# Patient Record
Sex: Female | Born: 1980 | Race: Black or African American | Hispanic: No | Marital: Married | Smoking: Never smoker
Health system: Southern US, Community
[De-identification: ages and names within clinical notes are randomized; demographics above are authoritative.]

---

## 2020-08-29 ENCOUNTER — Encounter (HOSPITAL_COMMUNITY): Payer: Self-pay | Admitting: Emergency Medicine

## 2020-08-29 ENCOUNTER — Other Ambulatory Visit: Payer: Self-pay

## 2020-08-29 ENCOUNTER — Ambulatory Visit (HOSPITAL_COMMUNITY)
Admission: EM | Admit: 2020-08-29 | Discharge: 2020-08-29 | Disposition: A | Discharge status: Home / Self Care | Payer: Self-pay | Attending: Urgent Care | Admitting: Urgent Care

## 2020-08-29 DIAGNOSIS — R519 Headache, unspecified: Secondary | ICD-10-CM | POA: Insufficient documentation

## 2020-08-29 DIAGNOSIS — R070 Pain in throat: Secondary | ICD-10-CM | POA: Insufficient documentation

## 2020-08-29 DIAGNOSIS — B349 Viral infection, unspecified: Secondary | ICD-10-CM | POA: Insufficient documentation

## 2020-08-29 DIAGNOSIS — Z20822 Contact with and (suspected) exposure to covid-19: Secondary | ICD-10-CM | POA: Insufficient documentation

## 2020-08-29 LAB — RESP PANEL BY RT-PCR (FLU A&B, COVID) ARPGX2
Influenza A by PCR: NEGATIVE
Influenza B by PCR: NEGATIVE
SARS Coronavirus 2 by RT PCR: NEGATIVE

## 2020-08-29 MED ORDER — BENZONATATE 100 MG PO CAPS: 100.0000 mg | ORAL_CAPSULE | Freq: Three times a day (TID) | ORAL | 0 refills | Status: AC | PRN

## 2020-08-29 MED ORDER — PSEUDOEPHEDRINE HCL 60 MG PO TABS: 60.0000 mg | ORAL_TABLET | Freq: Three times a day (TID) | ORAL | 0 refills | Status: AC | PRN

## 2020-08-29 MED ORDER — PROMETHAZINE-DM 6.25-15 MG/5ML PO SYRP: 5.0000 mL | ORAL_SOLUTION | Freq: Every evening | ORAL | 0 refills | Status: AC | PRN

## 2020-08-29 MED ORDER — CETIRIZINE HCL 10 MG PO TABS: 10.0000 mg | ORAL_TABLET | Freq: Every day | ORAL | 0 refills | Status: AC

## 2020-08-29 NOTE — ED Triage Notes (Signed)
PT C/O: cold sx onset 4 days...Marland Kitchen Just arrived from [place] in [place] ... Sx include: chills, fever, headache, sore throat, fatigue  Husband speaks [place] and is interpreting.   DENIES: v/d  TAKING MEDS: OTC acetaminophen  A&O x4... NAD... Ambulatory

## 2020-08-29 NOTE — Discharge Instructions (Signed)

## 2020-08-29 NOTE — ED Provider Notes (Signed)
  [facility]   MRN: [id number] DOB: Aug 27, 1981  Subjective:   Alexandria Walker is a 39 y.o. female presenting for 4-day history of acute onset chills, malaise and fatigue, subjective fever, headache, throat pain intermittent body aches.  Patient just arrived from a total last [place] Sunday when her symptoms started.  She has both Covid and flu vaccinated.  Denies chest pain, shortness of breath, loss of sense of taste and smell, rashes.  She is not currently taking any medications and has no known food or drug allergies.  Denies past medical and surgical history.  History reviewed. No pertinent family history.  Social History   Tobacco Use  . Smoking status: Never Smoker  Substance Use Topics  . Alcohol use: Not Currently  . Drug use: Never    ROS   Objective:   Vitals: BP 108/78 (BP Location: Right Arm)   Pulse 90   Temp 99.6 F (37.6 C) (Oral)   Resp 20   SpO2 99%   Physical Exam Constitutional:      General: She is not in acute distress.    Appearance: Normal appearance. She is well-developed. She is not ill-appearing, toxic-appearing or diaphoretic.  HENT:     Head: Normocephalic and atraumatic.     Nose: Nose normal.     Mouth/Throat:     Mouth: Mucous membranes are moist.     Pharynx: No oropharyngeal exudate or posterior oropharyngeal erythema.  Eyes:     General: No scleral icterus.       Right eye: No discharge.        Left eye: No discharge.     Extraocular Movements: Extraocular movements intact.     Conjunctiva/sclera: Conjunctivae normal.     Pupils: Pupils are equal, round, and reactive to light.  Cardiovascular:     Rate and Rhythm: Normal rate and regular rhythm.     Pulses: Normal pulses.     Heart sounds: Normal heart sounds. No murmur heard. No friction rub. No gallop.   Pulmonary:     Effort: Pulmonary effort is normal. No respiratory distress.     Breath sounds: Normal breath sounds. No stridor. No wheezing,  rhonchi or rales.  Skin:    General: Skin is warm and dry.     Findings: No rash.  Neurological:     Mental Status: She is alert and oriented to person, place, and time.  Psychiatric:        Mood and Affect: Mood normal.        Behavior: Behavior normal.        Thought Content: Thought content normal.        Judgment: Judgment normal.     Assessment and Plan :   PDMP not reviewed this encounter.  1. Viral syndrome   2. Throat pain     Suspect viral syndrome such as influenza but the common cold plan Covid are also possible.  Testing is pending.  Recommended supportive care.  Physical exam findings, vital signs stable for outpatient management. Counseled patient on potential for adverse effects with medications prescribed/recommended today, ER and return-to-clinic precautions discussed, patient verbalized understanding.    Wallis Bamberg, PA-C 08/29/20 1420

## 2021-02-09 ENCOUNTER — Other Ambulatory Visit: Payer: Self-pay

## 2021-02-09 ENCOUNTER — Emergency Department (HOSPITAL_COMMUNITY)
Admission: EM | Admit: 2021-02-09 | Discharge: 2021-02-10 | Disposition: A | Discharge status: Home / Self Care | Payer: Self-pay | Attending: Emergency Medicine | Admitting: Emergency Medicine

## 2021-02-09 DIAGNOSIS — Z20822 Contact with and (suspected) exposure to covid-19: Secondary | ICD-10-CM | POA: Insufficient documentation

## 2021-02-09 DIAGNOSIS — B9689 Other specified bacterial agents as the cause of diseases classified elsewhere: Secondary | ICD-10-CM | POA: Insufficient documentation

## 2021-02-09 DIAGNOSIS — R531 Weakness: Secondary | ICD-10-CM | POA: Insufficient documentation

## 2021-02-09 DIAGNOSIS — N390 Urinary tract infection, site not specified: Secondary | ICD-10-CM | POA: Insufficient documentation

## 2021-02-09 NOTE — ED Triage Notes (Signed)
[place] interpreter attempted for triage.  Pt came in accompanied by husband with c/o headache, generalized body aches and weakness for three. Days. Pt is febrile at 100.38f oral. Pt last took Tylenol yesterday

## 2021-02-10 ENCOUNTER — Emergency Department (HOSPITAL_COMMUNITY): Payer: Self-pay

## 2021-02-10 ENCOUNTER — Encounter (HOSPITAL_COMMUNITY): Payer: Self-pay | Admitting: Emergency Medicine

## 2021-02-10 LAB — CBC
HCT: 48 % — ABNORMAL HIGH (ref 36.0–46.0)
Hemoglobin: 15.1 g/dL — ABNORMAL HIGH (ref 12.0–15.0)
MCH: 27.5 pg (ref 26.0–34.0)
MCHC: 31.5 g/dL (ref 30.0–36.0)
MCV: 87.4 fL (ref 80.0–100.0)
Platelets: 180 10*3/uL (ref 150–400)
RBC: 5.49 MIL/uL — ABNORMAL HIGH (ref 3.87–5.11)
RDW: 13.1 % (ref 11.5–15.5)
WBC: 10.2 10*3/uL (ref 4.0–10.5)
nRBC: 0 % (ref 0.0–0.2)

## 2021-02-10 LAB — COMPREHENSIVE METABOLIC PANEL
ALT: 48 U/L — ABNORMAL HIGH (ref 0–44)
AST: 43 U/L — ABNORMAL HIGH (ref 15–41)
Albumin: 4 g/dL (ref 3.5–5.0)
Alkaline Phosphatase: 70 U/L (ref 38–126)
Anion gap: 9 (ref 5–15)
BUN: 13 mg/dL (ref 6–20)
CO2: 28 mmol/L (ref 22–32)
Calcium: 9.9 mg/dL (ref 8.9–10.3)
Chloride: 103 mmol/L (ref 98–111)
Creatinine, Ser: 0.94 mg/dL (ref 0.44–1.00)
GFR, Estimated: >60 mL/min (ref >60)
Glucose, Bld: 121 mg/dL — ABNORMAL HIGH (ref 70–99)
Potassium: 4.5 mmol/L (ref 3.5–5.1)
Sodium: 140 mmol/L (ref 135–145)
Total Bilirubin: 0.9 mg/dL (ref 0.3–1.2)
Total Protein: 9 g/dL — ABNORMAL HIGH (ref 6.5–8.1)

## 2021-02-10 LAB — URINALYSIS, ROUTINE W REFLEX MICROSCOPIC
Bilirubin Urine: NEGATIVE
Glucose, UA: NEGATIVE mg/dL
Ketones, ur: NEGATIVE mg/dL
Nitrite: POSITIVE — AB
Protein, ur: NEGATIVE mg/dL
Specific Gravity, Urine: 1.013 (ref 1.005–1.030)
pH: 6 (ref 5.0–8.0)

## 2021-02-10 LAB — PREGNANCY, URINE: Preg Test, Ur: NEGATIVE

## 2021-02-10 LAB — RESP PANEL BY RT-PCR (FLU A&B, COVID) ARPGX2
Influenza A by PCR: NEGATIVE
Influenza B by PCR: NEGATIVE
SARS Coronavirus 2 by RT PCR: NEGATIVE

## 2021-02-10 MED ORDER — SODIUM CHLORIDE 0.9 % IV BOLUS
1000.0000 mL | Freq: Once | INTRAVENOUS | Status: AC
  Administered 2021-02-10: 1000 mL via INTRAVENOUS

## 2021-02-10 MED ORDER — CEPHALEXIN 500 MG PO CAPS: 500.0000 mg | ORAL_CAPSULE | Freq: Three times a day (TID) | ORAL | 0 refills | Status: AC

## 2021-02-10 MED ORDER — SODIUM CHLORIDE 0.9 % IV SOLN
2.0000 g | Freq: Once | INTRAVENOUS | Status: AC
  Administered 2021-02-10: 03:00:00 2 g via INTRAVENOUS
  Filled 2021-02-10: qty 20

## 2021-02-10 MED ORDER — ONDANSETRON HCL 4 MG/2ML IJ SOLN
4.0000 mg | Freq: Once | INTRAMUSCULAR | Status: AC
  Administered 2021-02-10: 01:00:00 4 mg via INTRAVENOUS
  Filled 2021-02-10: qty 2

## 2021-02-10 MED ORDER — KETOROLAC TROMETHAMINE 15 MG/ML IJ SOLN
15.0000 mg | Freq: Once | INTRAMUSCULAR | Status: AC
  Administered 2021-02-10: 01:00:00 15 mg via INTRAVENOUS
  Filled 2021-02-10: qty 1

## 2021-02-10 NOTE — ED Provider Notes (Signed)
[facility]ENC[facility]PT [facility] Emergency Department Provider Note MRN:  [id number]  Arrival date & time: 02/10/21     Chief Complaint   Fever and Weakness   History of Present Illness   Alexandria Walker is a 40 y.o. year-old female with no pertinent past medical history presenting to the ED with chief complaint of fever.  Patient endorsing fever and body aches and malaise and fatigue for the past 2 to 3 days.  Denies any specific chest pain, no shortness of breath, no abdominal pain.  No nausea or vomiting or diarrhea.  No rash.  No recent travel.  Has received both COVID vaccines but no booster.  Symptoms are constant, moderate, no exacerbating or alleviating factors.  History obtained using [place] audio interpreter.  Review of Systems  A complete 10 system review of systems was obtained and all systems are negative except as noted in the HPI and PMH.   Patient's Health History   No past medical history on file.  No past surgical history on file.  No family history on file.  Social History   Socioeconomic History   Marital status: Married    Spouse name: Not on file   Number of children: Not on file   Years of education: Not on file   Highest education level: Not on file  Occupational History   Not on file  Tobacco Use   Smoking status: Never   Smokeless tobacco: Not on file  Substance and Sexual Activity   Alcohol use: Not Currently   Drug use: Never   Sexual activity: Not on file  Other Topics Concern   Not on file  Social History Narrative   Not on file   Social Determinants of Health   Financial Resource Strain: Not on file  Food Insecurity: Not on file  Transportation Needs: Not on file  Physical Activity: Not on file  Stress: Not on file  Social Connections: Not on file  Intimate Partner Violence: Not on file     Physical Exam   Vitals:   02/10/21 0230 02/10/21 0232  BP: 123/82 (!) 122/99  Pulse: 87 88  Resp: 16 18  Temp:    SpO2:  98% 98%    CONSTITUTIONAL: Well-appearing, NAD NEURO:  Alert and oriented x 3, no focal deficits EYES:  eyes equal and reactive ENT/NECK:  no LAD, no JVD CARDIO: Tachycardic rate, well-perfused, normal S1 and S2 PULM:  CTAB no wheezing or rhonchi GI/GU:  normal bowel sounds, non-distended, non-tender MSK/SPINE:  No gross deformities, no edema SKIN:  no rash, atraumatic PSYCH:  Appropriate speech and behavior  *Additional and/or pertinent findings included in MDM below  Diagnostic and Interventional Summary    EKG Interpretation  Date/Time:    Ventricular Rate:    PR Interval:    QRS Duration:   QT Interval:    QTC Calculation:   R Axis:     Text Interpretation:          Labs Reviewed  CBC - Abnormal; Notable for the following components:      Result Value   RBC 5.49 (*)    Hemoglobin 15.1 (*)    HCT 48.0 (*)    All other components within normal limits  URINALYSIS, ROUTINE W REFLEX MICROSCOPIC - Abnormal; Notable for the following components:   APPearance HAZY (*)    Hgb urine dipstick SMALL (*)    Nitrite POSITIVE (*)    Leukocytes,Ua MODERATE (*)    Bacteria, UA RARE (*)  All other components within normal limits  COMPREHENSIVE METABOLIC PANEL - Abnormal; Notable for the following components:   Glucose, Bld 121 (*)    Total Protein 9.0 (*)    AST 43 (*)    ALT 48 (*)    All other components within normal limits  RESP PANEL BY RT-PCR (FLU A&B, COVID) ARPGX2  PREGNANCY, URINE    DG Chest Port 1 View  Final Result      Medications  cefTRIAXone (ROCEPHIN) 2 g in sodium chloride 0.9 % 100 mL IVPB (2 g Intravenous New Bag/Given 02/10/21 0254)  sodium chloride 0.9 % bolus 1,000 mL (0 mLs Intravenous Stopped 02/10/21 0118)  ondansetron (ZOFRAN) injection 4 mg (4 mg Intravenous Given 02/10/21 0030)  ketorolac (TORADOL) 15 MG/ML injection 15 mg (15 mg Intravenous Given 02/10/21 0030)     Procedures  /  Critical Care Procedures  ED Course and Medical Decision  Making  I have reviewed the triage vital signs, the nursing notes, and pertinent available records from the EMR.  Listed above are laboratory and imaging tests that I personally ordered, reviewed, and interpreted and then considered in my medical decision making (see below for details).  Suspect viral illness, also considering bacterial UTI, pneumonia.  Poor p.o. intake, tachycardic, obtaining screening labs, x-ray, urinalysis and will reassess.     Work-up is overall reassuring.  Urinalysis consistent with infection.  Provided with IV ceftriaxone.  On reassessment patient is feeling much better, vital signs have normalized, seems appropriate for discharge on oral antibiotics with strict return precautions.  Elmer Sow. Pilar Plate, MD [facility] Emergency Medicine [facility] [email or web address]  Final Clinical Impressions(s) / ED Diagnoses     ICD-10-CM   1. Lower urinary tract infectious disease  N39.0       ED Discharge Orders          Ordered    cephALEXin (KEFLEX) 500 MG capsule  3 times daily        02/10/21 0318             Discharge Instructions Discussed with and Provided to Patient:     Discharge Instructions      You were evaluated in the Emergency Department and after careful evaluation, we did not find any emergent condition requiring admission or further testing in the hospital.  Your exam/testing today was overall reassuring.  Symptoms seem to be due to a bladder infection.  Please take the antibiotics as directed, use Tylenol or Motrin at home for pain.  Please return to the Emergency Department if you experience any worsening of your condition.  Thank you for allowing [place] to be a part of your care.         Sabas Sous, MD 02/10/21 [phone]

## 2021-02-10 NOTE — Discharge Instructions (Addendum)
You were evaluated in the Emergency Department and after careful evaluation, we did not find any emergent condition requiring admission or further testing in the hospital.  Your exam/testing today was overall reassuring.  Symptoms seem to be due to a bladder infection.  Please take the antibiotics as directed, use Tylenol or Motrin at home for pain.  Please return to the Emergency Department if you experience any worsening of your condition.  Thank you for allowing [place] to be a part of your care.

## 2021-06-21 ENCOUNTER — Ambulatory Visit: Payer: Self-pay | Admitting: Family

## 2021-06-21 ENCOUNTER — Ambulatory Visit: Payer: Self-pay | Admitting: Family Medicine

## 2021-12-28 ENCOUNTER — Emergency Department (HOSPITAL_COMMUNITY)
Admission: EM | Admit: 2021-12-28 | Discharge: 2021-12-28 | Disposition: A | Discharge status: Home / Self Care | Payer: BC Managed Care – PPO | Attending: Emergency Medicine | Admitting: Emergency Medicine

## 2021-12-28 ENCOUNTER — Encounter (HOSPITAL_COMMUNITY): Payer: Self-pay | Admitting: *Deleted

## 2021-12-28 ENCOUNTER — Other Ambulatory Visit: Payer: Self-pay

## 2021-12-28 DIAGNOSIS — N912 Amenorrhea, unspecified: Secondary | ICD-10-CM | POA: Diagnosis not present

## 2021-12-28 DIAGNOSIS — M79651 Pain in right thigh: Secondary | ICD-10-CM | POA: Diagnosis not present

## 2021-12-28 DIAGNOSIS — M25552 Pain in left hip: Secondary | ICD-10-CM | POA: Diagnosis not present

## 2021-12-28 DIAGNOSIS — M25551 Pain in right hip: Secondary | ICD-10-CM | POA: Diagnosis not present

## 2021-12-28 DIAGNOSIS — M79652 Pain in left thigh: Secondary | ICD-10-CM | POA: Insufficient documentation

## 2021-12-28 LAB — I-STAT BETA HCG BLOOD, ED (MC, WL, AP ONLY): I-stat hCG, quantitative: <5 m[IU]/mL (ref <5)

## 2021-12-28 MED ORDER — KETOROLAC TROMETHAMINE 30 MG/ML IJ SOLN
30.0000 mg | Freq: Once | INTRAMUSCULAR | Status: AC
  Administered 2021-12-28: 30 mg via INTRAMUSCULAR
  Filled 2021-12-28: qty 1

## 2021-12-28 NOTE — ED Triage Notes (Signed)
Last period Feb 23rd,  every month she dose not have bleeding but upper thigh pain, pain has been present 3 days this time. ?

## 2021-12-28 NOTE — Discharge Instructions (Signed)
Your pregnancy test was negative. You need to follow up with an OBGYN provider.  I have provided three phone numbers for you to call and set up an appointment for. You can take 400 mg of Ibuprofen every 6 hours for pain as needed.  ?

## 2021-12-28 NOTE — ED Provider Notes (Signed)
?[facility] DEPT ?Provider Note ? ? ?CSN: [id number] ?Arrival date & time: 12/28/21  1310 ? ?  ? ?History ?PMH: C section ?Chief Complaint  ?Patient presents with  ? Leg Pain  ? Amenorrhea  ? ?Patient opted for family member to be primary interpreter ?Alexandria Walker is a 41 y.o. female. ?Presents with a chief complaint of amenorrhea.  She has her last menstrual cycle was on February 23.  Previously, she had a menstrual cycle every month with light to moderate bleeding.  She states every time she is supposed to have a period, she has bilateral upper thigh pain.  She says this typically dissipates when the period should be over.  She describes it as a cramping sensation.  It is constant.  She has had this current episode for about 3 days.  States that she is sexually active.  She has not taken any pregnancy test.  She denies any new abdominal pain, vaginal discharge, flank pain, dysuria, or hematuria.  ? ? ?Leg Pain ? ?  ? ?Home Medications ?Prior to Admission medications   ?Medication Sig Start Date End Date Taking? Authorizing Provider  ?benzonatate (TESSALON) 100 MG capsule Take 1-2 capsules (100-200 mg total) by mouth 3 (three) times daily as needed for cough. 08/29/20   Jaynee Eagles, PA-C  ?cetirizine (ZYRTEC ALLERGY) 10 MG tablet Take 1 tablet (10 mg total) by mouth daily. 08/29/20   Jaynee Eagles, PA-C  ?promethazine-dextromethorphan (PROMETHAZINE-DM) 6.25-15 MG/5ML syrup Take 5 mLs by mouth at bedtime as needed for cough. 08/29/20   Jaynee Eagles, PA-C  ?pseudoephedrine (SUDAFED) 60 MG tablet Take 1 tablet (60 mg total) by mouth every 8 (eight) hours as needed for congestion. 08/29/20   Jaynee Eagles, PA-C  ?   ? ?Allergies    ?Patient has no known allergies.   ? ?Review of Systems   ?Review of Systems  ?Genitourinary:  Positive for menstrual problem.  ?Musculoskeletal:  Positive for myalgias.  ?All other systems reviewed and are negative. ? ?Physical Exam ?Updated Vital  Signs ?BP (!) 136/97 (BP Location: Left Arm)   Pulse 80   Temp 98.3 ?F (36.8 ?C) (Oral)   Resp 18   SpO2 98%  ?Physical Exam ?Vitals and nursing note reviewed.  ?Constitutional:   ?   General: She is not in acute distress. ?   Appearance: Normal appearance. She is well-developed. She is not ill-appearing, toxic-appearing or diaphoretic.  ?HENT:  ?   Head: Normocephalic and atraumatic.  ?   Nose: No nasal deformity.  ?   Mouth/Throat:  ?   Lips: Pink. No lesions.  ?Eyes:  ?   General: Gaze aligned appropriately. No scleral icterus.    ?   Right eye: No discharge.     ?   Left eye: No discharge.  ?   Conjunctiva/sclera: Conjunctivae normal.  ?   Right eye: Right conjunctiva is not injected. No exudate or hemorrhage. ?   Left eye: Left conjunctiva is not injected. No exudate or hemorrhage. ?Pulmonary:  ?   Effort: Pulmonary effort is normal. No respiratory distress.  ?Abdominal:  ?   General: Abdomen is flat. There is no distension.  ?   Palpations: Abdomen is soft. There is no mass.  ?   Tenderness: There is no abdominal tenderness. There is no right CVA tenderness, left CVA tenderness, guarding or rebound.  ?   Hernia: No hernia is present.  ?Musculoskeletal:  ?   Comments: Reproducible minimal bilateral hip  and upper thigh tenderness.  There is no swelling, deformity, or ecchymosis noted.  Patient has full range of motion of bilateral hip, knee, and ankles.  She has normal strength bilaterally.  Her sensation is intact bilaterally.  She has normal pedal 2+ pulses bilaterally.  ?Skin: ?   General: Skin is warm and dry.  ?Neurological:  ?   Mental Status: She is alert and oriented to person, place, and time.  ?Psychiatric:     ?   Mood and Affect: Mood normal.     ?   Speech: Speech normal.     ?   Behavior: Behavior normal. Behavior is cooperative.  ? ? ?ED Results / Procedures / Treatments   ?Labs ?(all labs ordered are listed, but only abnormal results are displayed) ?Labs Reviewed  ?I-STAT BETA HCG BLOOD, ED  ([facility], [facility], [facility] ONLY)  ? ? ?EKG ?None ? ?Radiology ?No results found. ? ?Procedures ?Procedures  ? ?Medications Ordered in ED ?Medications  ?ketorolac (TORADOL) 30 MG/ML injection 30 mg (30 mg Intramuscular Given 12/28/21 1356)  ? ? ?ED Course/ Medical Decision Making/ A&P ?  ?                        ?Medical Decision Making ?Risk ?Prescription drug management. ? ? ? ?MDM  ?This is a 41 y.o. female who presents to the ED with amenorrhea and bilateral thigh pain ?The differential of this patient includes but is not limited to pregnancy, hypothyroidism, endocrine disorder, menopause, PCOS, obstruction ? ?My Impression, Plan, and ED Course: Patient is overall well-appearing in no acute distress.  She has normal vitals.  She has not had a period in 2 months now.  She states she has had this intermittent bilateral upper thigh pain monthly that last about the same amount of time that her periods would last.  She has previously had normal regular periods.  She has not established with an OB/GYN.  Her abdominal exam is unremarkable. ?From an emergency standpoint, will rule out pregnancy. ? ?I personally ordered, reviewed, and interpreted all laboratory work and imaging and agree with radiologist interpretation. Results interpreted below: Pregnancy negative.  ? ? ?Patient is not pregnant.  Pain improved after Toradol injection.  I recommended several OB/GYN's for patient to contact for further work-up of amenorrhea.  Pain is patient been recently screened and does not have an emergent process at this time.  She will need to follow-up outpatient for further management. ? ?Charting Requirements ?Additional history is obtained from:  Independent historian and Spouse/Significant Other ?External Records from outside source obtained and reviewed including: Reviewed prior labs ?Social Determinants of Health:   language barrier, access to care ?Pertinant PMH that complicates patient's illness: n/a ? ?Patient Care ?Problems that were  addressed during this visit: ?- Amenorrhea: Acute illness with complication ?- Bilateral thigh pain: Acute illness ?Medications given in ED: Toradol ?Reevaluation of the patient after these medicines showed that the patient resolved ?I have reviewed home medications and made changes accordingly. Recommend Ibuprofen as needed. ?Disposition: discharge. F/u with OBGYN.  ? ?Portions of this note were generated with Lobbyist. Dictation errors may occur despite best attempts at proofreading. ?  ? ?Final Clinical Impression(s) / ED Diagnoses ?Final diagnoses:  ?Amenorrhea  ? ? ?Rx / DC Orders ?ED Discharge Orders   ? ? None  ? ?  ? ? ?  ?Adolphus Birchwood, PA-C ?12/28/21 1456 ? ?  ?Regan Lemming, MD ?12/28/21 1520 ? ?

## 2022-02-12 DIAGNOSIS — Z3200 Encounter for pregnancy test, result unknown: Secondary | ICD-10-CM | POA: Diagnosis not present

## 2022-02-12 DIAGNOSIS — N911 Secondary amenorrhea: Secondary | ICD-10-CM | POA: Diagnosis not present

## 2022-03-20 DIAGNOSIS — N911 Secondary amenorrhea: Secondary | ICD-10-CM | POA: Diagnosis not present

## 2022-03-20 DIAGNOSIS — Z3141 Encounter for fertility testing: Secondary | ICD-10-CM | POA: Diagnosis not present

## 2023-01-27 DIAGNOSIS — R0789 Other chest pain: Secondary | ICD-10-CM | POA: Diagnosis not present

## 2023-01-27 DIAGNOSIS — M159 Polyosteoarthritis, unspecified: Secondary | ICD-10-CM | POA: Diagnosis not present

## 2023-01-27 DIAGNOSIS — Z6841 Body Mass Index (BMI) 40.0 and over, adult: Secondary | ICD-10-CM | POA: Diagnosis not present

## 2023-02-19 DIAGNOSIS — N912 Amenorrhea, unspecified: Secondary | ICD-10-CM | POA: Diagnosis not present

## 2023-02-23 ENCOUNTER — Other Ambulatory Visit: Payer: Self-pay

## 2023-02-23 ENCOUNTER — Encounter (HOSPITAL_COMMUNITY): Payer: Self-pay | Admitting: *Deleted

## 2023-02-23 ENCOUNTER — Emergency Department (HOSPITAL_COMMUNITY)
Admission: EM | Admit: 2023-02-23 | Discharge: 2023-02-23 | Discharge status: Against Medical Advice | Payer: BC Managed Care – PPO | Attending: Emergency Medicine | Admitting: Emergency Medicine

## 2023-02-23 ENCOUNTER — Emergency Department (HOSPITAL_COMMUNITY): Payer: BC Managed Care – PPO

## 2023-02-23 DIAGNOSIS — Y92410 Unspecified street and highway as the place of occurrence of the external cause: Secondary | ICD-10-CM | POA: Insufficient documentation

## 2023-02-23 DIAGNOSIS — S39012A Strain of muscle, fascia and tendon of lower back, initial encounter: Secondary | ICD-10-CM | POA: Diagnosis not present

## 2023-02-23 DIAGNOSIS — M25559 Pain in unspecified hip: Secondary | ICD-10-CM | POA: Diagnosis not present

## 2023-02-23 DIAGNOSIS — D696 Thrombocytopenia, unspecified: Secondary | ICD-10-CM | POA: Diagnosis not present

## 2023-02-23 DIAGNOSIS — R0789 Other chest pain: Secondary | ICD-10-CM | POA: Insufficient documentation

## 2023-02-23 DIAGNOSIS — Z5329 Procedure and treatment not carried out because of patient's decision for other reasons: Secondary | ICD-10-CM | POA: Diagnosis not present

## 2023-02-23 DIAGNOSIS — R079 Chest pain, unspecified: Secondary | ICD-10-CM

## 2023-02-23 DIAGNOSIS — S3992XA Unspecified injury of lower back, initial encounter: Secondary | ICD-10-CM | POA: Diagnosis not present

## 2023-02-23 DIAGNOSIS — R0989 Other specified symptoms and signs involving the circulatory and respiratory systems: Secondary | ICD-10-CM | POA: Diagnosis not present

## 2023-02-23 LAB — BASIC METABOLIC PANEL
Anion gap: 6 (ref 5–15)
BUN: 11 mg/dL (ref 6–20)
CO2: 27 mmol/L (ref 22–32)
Calcium: 9.2 mg/dL (ref 8.9–10.3)
Chloride: 104 mmol/L (ref 98–111)
Creatinine, Ser: 0.81 mg/dL (ref 0.44–1.00)
GFR, Estimated: >60 mL/min (ref >60)
Glucose, Bld: 101 mg/dL — ABNORMAL HIGH (ref 70–99)
Potassium: 4.1 mmol/L (ref 3.5–5.1)
Sodium: 137 mmol/L (ref 135–145)

## 2023-02-23 LAB — CBC
HCT: 43.1 % (ref 36.0–46.0)
Hemoglobin: 13.7 g/dL (ref 12.0–15.0)
MCH: 29.1 pg (ref 26.0–34.0)
MCHC: 31.8 g/dL (ref 30.0–36.0)
MCV: 91.7 fL (ref 80.0–100.0)
Platelets: 122 10*3/uL — ABNORMAL LOW (ref 150–400)
RBC: 4.7 MIL/uL (ref 3.87–5.11)
RDW: 12.8 % (ref 11.5–15.5)
WBC: 6.1 10*3/uL (ref 4.0–10.5)
nRBC: 0 % (ref 0.0–0.2)

## 2023-02-23 LAB — I-STAT BETA HCG BLOOD, ED (MC, WL, AP ONLY): I-stat hCG, quantitative: <5 m[IU]/mL (ref <5)

## 2023-02-23 LAB — TROPONIN I (HIGH SENSITIVITY)
Troponin I (High Sensitivity): 3 ng/L (ref <18)
Troponin I (High Sensitivity): 3 ng/L (ref <18)

## 2023-02-23 MED ORDER — CYCLOBENZAPRINE HCL 10 MG PO TABS
10.0000 mg | ORAL_TABLET | Freq: Once | ORAL | Status: AC
  Administered 2023-02-23: 10 mg via ORAL
  Filled 2023-02-23: qty 1

## 2023-02-23 MED ORDER — MORPHINE SULFATE (PF) 4 MG/ML IV SOLN
4.0000 mg | Freq: Once | INTRAVENOUS | Status: AC
  Administered 2023-02-23: 4 mg via INTRAVENOUS
  Filled 2023-02-23: qty 1

## 2023-02-23 MED ORDER — KETOROLAC TROMETHAMINE 15 MG/ML IJ SOLN
15.0000 mg | Freq: Once | INTRAMUSCULAR | Status: AC
  Administered 2023-02-23: 15 mg via INTRAVENOUS
  Filled 2023-02-23: qty 1

## 2023-02-23 MED ORDER — ONDANSETRON HCL 4 MG/2ML IJ SOLN
4.0000 mg | Freq: Once | INTRAMUSCULAR | Status: AC
  Administered 2023-02-23: 4 mg via INTRAVENOUS
  Filled 2023-02-23: qty 2
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
## 2023-02-23 NOTE — ED Notes (Signed)
Blood draw unsuccessful 
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
## 2023-02-23 NOTE — ED Notes (Signed)
EDP notified that patient left AMA .  °

## 2023-03-11 DIAGNOSIS — N912 Amenorrhea, unspecified: Secondary | ICD-10-CM | POA: Diagnosis not present

## 2023-03-17 ENCOUNTER — Other Ambulatory Visit: Payer: Self-pay | Admitting: Nurse Practitioner

## 2023-03-17 ENCOUNTER — Other Ambulatory Visit (HOSPITAL_COMMUNITY)
Admission: RE | Admit: 2023-03-17 | Discharge: 2023-03-17 | Disposition: A | Discharge status: Home / Self Care | Payer: BC Managed Care – PPO | Source: Ambulatory Visit | Attending: Nurse Practitioner | Admitting: Nurse Practitioner

## 2023-03-17 DIAGNOSIS — N858 Other specified noninflammatory disorders of uterus: Secondary | ICD-10-CM | POA: Diagnosis not present

## 2023-03-17 DIAGNOSIS — R9389 Abnormal findings on diagnostic imaging of other specified body structures: Secondary | ICD-10-CM | POA: Diagnosis not present

## 2023-03-17 DIAGNOSIS — Z124 Encounter for screening for malignant neoplasm of cervix: Secondary | ICD-10-CM | POA: Insufficient documentation

## 2023-03-17 DIAGNOSIS — Z3202 Encounter for pregnancy test, result negative: Secondary | ICD-10-CM | POA: Diagnosis not present

## 2023-03-17 DIAGNOSIS — Z01419 Encounter for gynecological examination (general) (routine) without abnormal findings: Secondary | ICD-10-CM | POA: Diagnosis not present

## 2023-03-17 DIAGNOSIS — N912 Amenorrhea, unspecified: Secondary | ICD-10-CM | POA: Diagnosis not present

## 2023-03-17 DIAGNOSIS — N939 Abnormal uterine and vaginal bleeding, unspecified: Secondary | ICD-10-CM | POA: Diagnosis not present

## 2023-03-19 LAB — CYTOLOGY - PAP
Adequacy: ABSENT
Comment: NEGATIVE
Diagnosis: NEGATIVE
High risk HPV: NEGATIVE

## 2023-04-06 IMAGING — DX DG CHEST 1V PORT
1 series · 1 of 1 positions shown · non-contrast
Comparison: None.

CLINICAL DATA: Fever, weakness

EXAM:
PORTABLE CHEST 1 VIEW

[chest ap]
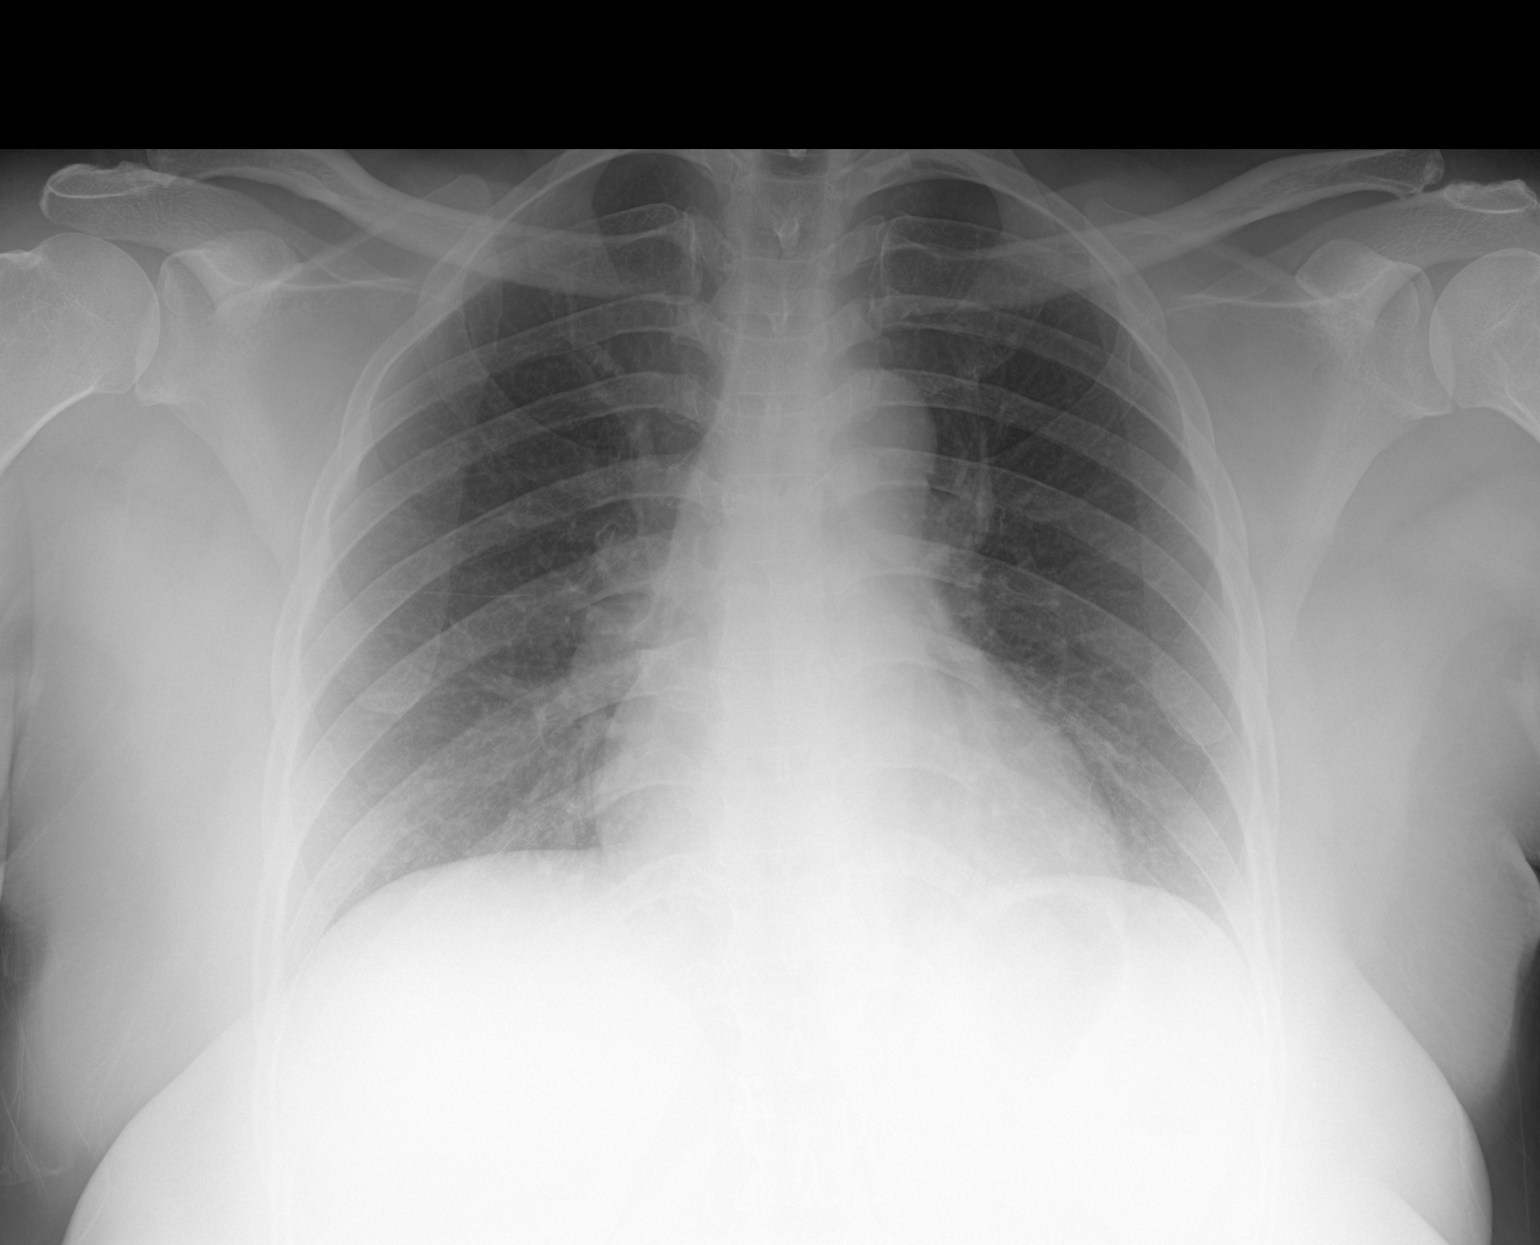

[1 of 1 positions shown; findings below may reference images not displayed]

FINDINGS: Accounting for body habitus, the lungs are clear. No consolidation,
features of edema, pneumothorax, or effusion. Pulmonary vascularity
is normally distributed. The cardiomediastinal contours are
unremarkable. No acute osseous or soft tissue abnormality.
IMPRESSION: No acute cardiopulmonary abnormality.

## 2023-06-25 DIAGNOSIS — E2839 Other primary ovarian failure: Secondary | ICD-10-CM | POA: Diagnosis not present

## 2023-06-25 DIAGNOSIS — Z79890 Hormone replacement therapy (postmenopausal): Secondary | ICD-10-CM | POA: Diagnosis not present

## 2023-09-25 ENCOUNTER — Encounter (HOSPITAL_COMMUNITY): Payer: Self-pay | Admitting: *Deleted

## 2023-09-25 ENCOUNTER — Emergency Department (HOSPITAL_COMMUNITY)
Admission: EM | Admit: 2023-09-25 | Discharge: 2023-09-26 | Disposition: A | Discharge status: Home / Self Care | Payer: BC Managed Care – PPO | Attending: Emergency Medicine | Admitting: Emergency Medicine

## 2023-09-25 DIAGNOSIS — R935 Abnormal findings on diagnostic imaging of other abdominal regions, including retroperitoneum: Secondary | ICD-10-CM | POA: Diagnosis not present

## 2023-09-25 DIAGNOSIS — R109 Unspecified abdominal pain: Secondary | ICD-10-CM | POA: Diagnosis not present

## 2023-09-25 DIAGNOSIS — K5900 Constipation, unspecified: Secondary | ICD-10-CM | POA: Insufficient documentation

## 2023-09-25 DIAGNOSIS — K389 Disease of appendix, unspecified: Secondary | ICD-10-CM

## 2023-09-25 DIAGNOSIS — K381 Appendicular concretions: Secondary | ICD-10-CM | POA: Diagnosis not present

## 2023-09-25 DIAGNOSIS — R1084 Generalized abdominal pain: Secondary | ICD-10-CM | POA: Diagnosis not present

## 2023-09-25 LAB — COMPREHENSIVE METABOLIC PANEL
ALT: 10 U/L (ref 0–44)
AST: 42 U/L — ABNORMAL HIGH (ref 15–41)
Albumin: 3.6 g/dL (ref 3.5–5.0)
Alkaline Phosphatase: 57 U/L (ref 38–126)
Anion gap: 10 (ref 5–15)
BUN: 12 mg/dL (ref 6–20)
CO2: 23 mmol/L (ref 22–32)
Calcium: 9.2 mg/dL (ref 8.9–10.3)
Chloride: 107 mmol/L (ref 98–111)
Creatinine, Ser: 0.77 mg/dL (ref 0.44–1.00)
GFR, Estimated: >60 mL/min (ref >60)
Glucose, Bld: 134 mg/dL — ABNORMAL HIGH (ref 70–99)
Potassium: 3.8 mmol/L (ref 3.5–5.1)
Sodium: 140 mmol/L (ref 135–145)
Total Bilirubin: 0.7 mg/dL (ref 0.0–1.2)
Total Protein: 7.5 g/dL (ref 6.5–8.1)

## 2023-09-25 LAB — CBC
HCT: 43.5 % (ref 36.0–46.0)
Hemoglobin: 13.6 g/dL (ref 12.0–15.0)
MCH: 27.8 pg (ref 26.0–34.0)
MCHC: 31.3 g/dL (ref 30.0–36.0)
MCV: 89 fL (ref 80.0–100.0)
Platelets: 172 10*3/uL (ref 150–400)
RBC: 4.89 MIL/uL (ref 3.87–5.11)
RDW: 13.1 % (ref 11.5–15.5)
WBC: 4.8 10*3/uL (ref 4.0–10.5)
nRBC: 0.4 % — ABNORMAL HIGH (ref 0.0–0.2)

## 2023-09-25 LAB — HCG, SERUM, QUALITATIVE: Preg, Serum: NEGATIVE

## 2023-09-25 LAB — LIPASE, BLOOD: Lipase: 37 U/L (ref 11–51)

## 2023-09-25 MED ORDER — FENTANYL CITRATE PF 50 MCG/ML IJ SOSY
50.0000 ug | PREFILLED_SYRINGE | INTRAMUSCULAR | Status: DC | PRN
  Administered 2023-09-25: 50 ug via INTRAVENOUS
  Filled 2023-09-25: qty 1

## 2023-09-25 MED ORDER — ONDANSETRON HCL 4 MG/2ML IJ SOLN
4.0000 mg | Freq: Once | INTRAMUSCULAR | Status: AC | PRN
  Administered 2023-09-25: 4 mg via INTRAVENOUS
  Filled 2023-09-25: qty 2

## 2023-09-25 NOTE — ED Triage Notes (Signed)
Pt presents with generalized abd pain with NVD for 2 days; hx of "stomach problems"; pt restless and uncomfortable

## 2023-09-26 ENCOUNTER — Emergency Department (HOSPITAL_COMMUNITY): Payer: BC Managed Care – PPO

## 2023-09-26 DIAGNOSIS — R935 Abnormal findings on diagnostic imaging of other abdominal regions, including retroperitoneum: Secondary | ICD-10-CM | POA: Diagnosis not present

## 2023-09-26 DIAGNOSIS — K5900 Constipation, unspecified: Secondary | ICD-10-CM | POA: Diagnosis not present

## 2023-09-26 DIAGNOSIS — R109 Unspecified abdominal pain: Secondary | ICD-10-CM | POA: Diagnosis not present

## 2023-09-26 MED ORDER — IOHEXOL 300 MG/ML  SOLN
100.0000 mL | Freq: Once | INTRAMUSCULAR | Status: AC | PRN
  Administered 2023-09-26: 100 mL via INTRAVENOUS

## 2023-09-26 MED ORDER — SODIUM CHLORIDE 0.9 % IV BOLUS
1000.0000 mL | Freq: Once | INTRAVENOUS | Status: AC
  Administered 2023-09-26: 1000 mL via INTRAVENOUS

## 2023-09-26 NOTE — ED Provider Notes (Signed)
[place] EMERGENCY DEPARTMENT AT [facility] Provider Note   CSN: [id number] Arrival date & time: 09/25/23  2224     History  Chief Complaint  Patient presents with   Abdominal Pain    Alexandria Walker is a 43 y.o. female.   Abdominal Pain Pain location:  Generalized Pain quality: aching   Pain radiates to:  Does not radiate Pain severity:  Mild Duration:  2 days Timing:  Intermittent Progression:  Waxing and waning Chronicity:  New Context: not sick contacts   Context comment:  /N/V Relieved by:  Nothing Worsened by:  Nothing Ineffective treatments:  None tried Risk factors: multiple surgeries        Home Medications Prior to Admission medications   Medication Sig Start Date End Date Taking? Authorizing Provider  benzonatate (TESSALON) 100 MG capsule Take 1-2 capsules (100-200 mg total) by mouth 3 (three) times daily as needed for cough. 08/29/20   Wallis Bamberg, PA-C  cetirizine (ZYRTEC ALLERGY) 10 MG tablet Take 1 tablet (10 mg total) by mouth daily. 08/29/20   Wallis Bamberg, PA-C  promethazine-dextromethorphan (PROMETHAZINE-DM) 6.25-15 MG/5ML syrup Take 5 mLs by mouth at bedtime as needed for cough. 08/29/20   Wallis Bamberg, PA-C  pseudoephedrine (SUDAFED) 60 MG tablet Take 1 tablet (60 mg total) by mouth every 8 (eight) hours as needed for congestion. 08/29/20   Wallis Bamberg, PA-C      Allergies    Patient has no known allergies.    Review of Systems   Review of Systems  Gastrointestinal:  Positive for abdominal pain.    Physical Exam Updated Vital Signs BP (!) 145/90 (BP Location: Right Arm)   Pulse 63   Temp 98 F (36.7 C) (Oral)   Resp 12   SpO2 100%  Physical Exam Vitals and nursing note reviewed.  Constitutional:      General: She is not in acute distress.    Appearance: She is well-developed. She is not ill-appearing.  HENT:     Head: Normocephalic and atraumatic.     Mouth/Throat:     Mouth: Mucous membranes are moist.   Eyes:     Extraocular Movements: Extraocular movements intact.     Conjunctiva/sclera: Conjunctivae normal.  Cardiovascular:     Rate and Rhythm: Normal rate and regular rhythm.     Heart sounds: Normal heart sounds. No murmur heard. Pulmonary:     Effort: Pulmonary effort is normal. No respiratory distress.     Breath sounds: Normal breath sounds.  Abdominal:     General: Abdomen is flat.     Palpations: Abdomen is soft.     Tenderness: There is generalized abdominal tenderness.  Musculoskeletal:        General: No swelling.     Cervical back: Neck supple.  Skin:    General: Skin is warm and dry.     Capillary Refill: Capillary refill takes less than 2 seconds.  Neurological:     Mental Status: She is alert.  Psychiatric:        Mood and Affect: Mood normal.     ED Results / Procedures / Treatments   Labs (all labs ordered are listed, but only abnormal results are displayed) Labs Reviewed  COMPREHENSIVE METABOLIC PANEL - Abnormal; Notable for the following components:      Result Value   Glucose, Bld 134 (*)    AST 42 (*)    All other components within normal limits  CBC - Abnormal; Notable for the  following components:   nRBC 0.4 (*)    All other components within normal limits  LIPASE, BLOOD  HCG, SERUM, QUALITATIVE    EKG None  Radiology CT ABDOMEN PELVIS W CONTRAST Result Date: 09/26/2023 CLINICAL DATA:  43 year old female with abdominal pain, nausea vomiting diarrhea for 2 days. EXAM: CT ABDOMEN AND PELVIS WITH CONTRAST TECHNIQUE: Multidetector CT imaging of the abdomen and pelvis was performed using the standard protocol following bolus administration of intravenous contrast. RADIATION DOSE REDUCTION: This exam was performed according to the departmental dose-optimization program which includes automated exposure control, adjustment of the mA and/or kV according to patient size and/or use of iterative reconstruction technique. CONTRAST:  OMNIPAQUE IOHEXOL  300 MG/ML  SOLN COMPARISON:  None Available. FINDINGS: Lower chest: Negative; minor lung base atelectasis. Hepatobiliary: Negative liver and gallbladder. No bile duct enlargement. Pancreas: Negative. Spleen: Negative. Adrenals/Urinary Tract: Normal adrenal glands. Nonobstructed kidneys with symmetric renal enhancement. No nephrolithiasis. Symmetric and normal contrast excretion to nondilated ureters. Decompressed, unremarkable bladder. Stomach/Bowel: Redundant large bowel with retained gas and stool throughout. Moderate volume retained stool overall. Abnormal appendix with evidence of fecalith at the cecum orifice of the appendix (series 3, image 56, coronal image 58) and diminutive but heterogeneous and coarsely calcified appendix itself. No regional inflammation. Decompressed nearby terminal ileum.  No dilated small bowel. Scattered punctate calcifications in the omentum (series 3, image 36, coronal image 42) with no mesenteric inflammation identified. No free air or free fluid. No dilated small bowel.  Stomach and duodenum appear negative. Vascular/Lymphatic: Major arterial structures in the abdomen and pelvis appear patent and normal. Portal venous system appears patent. No lymphadenopathy identified. Reproductive: Within normal limits. Other: No pelvis free fluid. Left flank calcified injection granuloma or fat necrosis incidentally noted. Musculoskeletal: Lumbar disc and endplate degeneration. No acute osseous abnormality identified. IMPRESSION: 1. Abnormal but non-inflamed appendix: Diminutive and heterogeneously calcified appendix. Evidence of fecalith in the cecum at the appendix orifice. Scattered punctate calcifications in the omentum.  No ascites. 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 2. Moderate large bowel retained stool. Otherwise negative CT appearance of the  abdomen and pelvis. Electronically Signed   By: Odessa Fleming M.D.   On: 09/26/2023 08:28    Procedures Procedures    Medications Ordered in ED Medications  ondansetron [facility]) injection 4 mg (4 mg Intravenous Given 09/25/23 2254)  sodium chloride 0.9 % bolus 1,000 mL (1,000 mLs Intravenous New Bag/Given 09/26/23 0802)  iohexol (OMNIPAQUE) 300 MG/ML solution 100 mL (100 mLs Intravenous Contrast Given 09/26/23 0736)    ED Course/ Medical Decision Making/ A&P                                 Medical Decision Making Amount and/or Complexity of Data Reviewed Labs: ordered. Radiology: ordered.  Risk Prescription drug management.   Alexandria Walker is here with intermittent abdominal pain for the last few weeks.  But more generalized today.  She is feeling constipated.  She has had abdominal surgery in the past.  She has felt nauseous but denies any diarrhea or vomiting.  Nothing makes it worse or better.  Denies any fevers or chills.  No pain with urination.  Her main concern is constipation.  Will get CBC CMP lipase CT scan abdomen pelvis.  Will give fluid bolus and Zofran.  Per my review and interpretation of labs no significant anemia or electrolyte abnormality or kidney injury or leukocytosis.  Gallbladder and liver enzymes within normal limits.  CT scan showed abnormal but not inflamed appendix.  Seems to be calcified with a small appendicolith.  Overall they do think that this could be mucinous tumor or some other postinflammatory process.  I talked with Dr. Luisa Hart with general surgery who agree that this is not an acute appendicitis and actually would likely benefit from a GI workup and colonoscopy first to evaluate this area.  Patient was made aware of this and I will give him referral to gastroenterology.  Otherwise patient does appear to be constipated on CT scan.  Will have her do MiraLAX.  Discharged in good condition.  Understands return precautions.  She not having any urinary  symptoms.  Discharged in good condition.  This chart was dictated using voice recognition software.  Despite best efforts to proofread,  errors can occur which can change the documentation meaning.         Final Clinical Impression(s) / ED Diagnoses Final diagnoses:  Appendicolith  Constipation, unspecified constipation type    Rx / DC Orders ED Discharge Orders     None         Virgina Norfolk, DO 09/26/23 1020

## 2023-09-26 NOTE — Discharge Instructions (Addendum)
Follow-up with gastroenterology regarding masslike structure/appendicolith in your cecum as we discussed.  Discussed this with your primary care doctor as well.  I recommend for constipation 3-4 capfuls of MiraLAX in 20 ounces of water daily until desired effect.  You can add more capfuls if needed.  Return if symptoms worsen.

## 2023-11-03 DIAGNOSIS — K5904 Chronic idiopathic constipation: Secondary | ICD-10-CM | POA: Diagnosis not present

## 2023-11-03 DIAGNOSIS — R933 Abnormal findings on diagnostic imaging of other parts of digestive tract: Secondary | ICD-10-CM | POA: Diagnosis not present

## 2023-11-03 DIAGNOSIS — R1084 Generalized abdominal pain: Secondary | ICD-10-CM | POA: Diagnosis not present

## 2024-07-13 DIAGNOSIS — Z79890 Hormone replacement therapy (postmenopausal): Secondary | ICD-10-CM | POA: Diagnosis not present

## 2024-07-13 DIAGNOSIS — E2839 Other primary ovarian failure: Secondary | ICD-10-CM | POA: Diagnosis not present

## 2024-07-13 DIAGNOSIS — Z23 Encounter for immunization: Secondary | ICD-10-CM | POA: Diagnosis not present

## 2024-07-21 DIAGNOSIS — Z01419 Encounter for gynecological examination (general) (routine) without abnormal findings: Secondary | ICD-10-CM | POA: Diagnosis not present

## 2024-07-21 DIAGNOSIS — E2839 Other primary ovarian failure: Secondary | ICD-10-CM | POA: Diagnosis not present
